# Patient Record
Sex: Female | Born: 1975 | Race: Black or African American | Hispanic: No | Marital: Married
Health system: Southern US, Community
[De-identification: ages and names within clinical notes are randomized; demographics above are authoritative.]

---

## 2006-08-15 ENCOUNTER — Emergency Department: Payer: Self-pay | Admitting: Emergency Medicine

## 2007-11-18 ENCOUNTER — Inpatient Hospital Stay: Payer: Self-pay | Admitting: Obstetrics & Gynecology

## 2017-04-18 ENCOUNTER — Other Ambulatory Visit: Payer: Self-pay | Admitting: Family Medicine

## 2017-04-18 DIAGNOSIS — Z1231 Encounter for screening mammogram for malignant neoplasm of breast: Secondary | ICD-10-CM

## 2017-05-15 ENCOUNTER — Ambulatory Visit
Admission: RE | Admit: 2017-05-15 | Discharge: 2017-05-15 | Disposition: A | Discharge status: Home / Self Care | Payer: 59 | Source: Ambulatory Visit | Attending: Family Medicine | Admitting: Family Medicine

## 2017-05-15 ENCOUNTER — Other Ambulatory Visit: Payer: Self-pay | Admitting: Family Medicine

## 2017-05-15 DIAGNOSIS — Z1231 Encounter for screening mammogram for malignant neoplasm of breast: Secondary | ICD-10-CM | POA: Diagnosis present

## 2017-12-06 ENCOUNTER — Emergency Department
Admission: EM | Admit: 2017-12-06 | Discharge: 2017-12-06 | Disposition: A | Discharge status: Home / Self Care | Payer: 59 | Attending: Emergency Medicine | Admitting: Emergency Medicine

## 2017-12-06 ENCOUNTER — Other Ambulatory Visit: Payer: Self-pay

## 2017-12-06 ENCOUNTER — Emergency Department: Payer: 59

## 2017-12-06 DIAGNOSIS — M25561 Pain in right knee: Secondary | ICD-10-CM | POA: Insufficient documentation

## 2017-12-06 MED ORDER — TRAMADOL HCL 50 MG PO TABS
50.0000 mg | ORAL_TABLET | Freq: Once | ORAL | Status: AC
  Administered 2017-12-06: 50 mg via ORAL
  Filled 2017-12-06: qty 1

## 2017-12-06 MED ORDER — TRAMADOL HCL 50 MG PO TABS: 50.0000 mg | ORAL_TABLET | Freq: Three times a day (TID) | ORAL | 0 refills | Status: AC | PRN

## 2017-12-06 NOTE — ED Triage Notes (Signed)
Pt states right knee pain since this am. Pt denies known injury. No redness, increased warmth or swelling noted to knee. Pt ambulatory slowly but is able to bear weight.

## 2017-12-06 NOTE — Discharge Instructions (Addendum)
You have been diagnosed with acute knee pain. Your xray was negative for any acute findings. I have given you a RX for Tramadol to take as needed for severe pain. Ice may help reduce pain and inflammation. No heavy lifting, exercise for 1 week. Follow up with your PCP if worsens.

## 2017-12-06 NOTE — ED Provider Notes (Signed)
[facility] Emergency Department Provider Note ____________________________________________  Time seen: 2200  I have reviewed the triage vital signs and the nursing notes.  HISTORY  Chief Complaint  Knee Pain   HPI Heidi Koch is a 42 y.o. female presents to the clinic today with complaint of right knee pain.  She reports she woke up this morning with the knee pain.  She describes the pain is sharp and stabbing.  The pain radiates into her lower leg.  She denies numbness, tingling or weakness.  She denies any injury to the area.  She has not noticed any swelling or bruising.  She is taken ibuprofen with some relief. No past medical history on file.  There are no active problems to display for this patient.     Prior to Admission medications   Medication Sig Start Date End Date Taking? Authorizing Provider  traMADol (ULTRAM) 50 MG tablet Take 1 tablet (50 mg total) by mouth every 8 (eight) hours as needed. 12/06/17 12/06/18  Lorre Munroe, NP    Allergies Patient has no known allergies.  No family history on file.  Social History Social History   Tobacco Use  . Smoking status: Not on file  Substance Use Topics  . Alcohol use: Not on file  . Drug use: Not on file    Review of Systems  Constitutional: Negative for fever. Musculoskeletal: Positive for right knee pain.  Skin: Negative for redness, warmth or swelling. Neurological: Negative for headaches, focal weakness or numbness. ____________________________________________  PHYSICAL EXAM:  VITAL SIGNS: ED Triage Vitals  Enc Vitals Group     BP 12/06/17 2133 (!) 135/97     Pulse Rate 12/06/17 2133 89     Resp 12/06/17 2133 16     Temp 12/06/17 2133 98.9 F (37.2 C)     Temp Source 12/06/17 2133 Oral     SpO2 12/06/17 2133 100 %     Weight 12/06/17 2134 170 lb (77.1 kg)     Height 12/06/17 2134 5\' 4"  (1.626 m)     Head Circumference --      Peak Flow --      Pain Score 12/06/17  2133 10     Pain Loc --      Pain Edu? --      Excl. in GC? --     Constitutional: Alert and oriented. Well appearing and in no distress. Musculoskeletal: Normal flexion and extension of the right knee.  No joint swelling noted.  Pain with palpation of the lateral joint line.  Negative drawer test.  Negative Lachman's.  Strength 5/5 bilateral lower extremities. Neurologic:  Normal gait without ataxia. No gross focal neurologic deficits are appreciated.  ____________________________________________   RADIOLOGY   Imaging Orders     DG Knee Complete 4 Views Right  IMPRESSION: Negative. ____________________________________________  INITIAL IMPRESSION / ASSESSMENT AND PLAN / ED COURSE  Acute Right Knee Pain:  DDX include arthritis, gout, meniscal tear Xray negative for acute findings Tramadol 50 mg given in ER RX provided for Tramadol 50 mg Q8H prn Ice may be helpful She declines referral to ortho at this time   I reviewed the patient's prescription history over the last 12 months in the multi-state controlled substances database(s) that includes [place], [place], [place], [place], [place], [place], [place], [place], [place], [place], [place], [place], [place], and [place].  Results were notable for no controlled substances given. ____________________________________________  FINAL CLINICAL IMPRESSION(S) / ED DIAGNOSES  Final diagnoses:  Acute pain of right knee      Lorre MunroeBaity, Mandy Fitzwater W, NP 12/06/17 2240    Sharman CheekStafford, Phillip, MD 12/07/17 1925

## 2018-04-20 ENCOUNTER — Other Ambulatory Visit: Payer: Self-pay | Admitting: Family Medicine

## 2018-04-20 DIAGNOSIS — Z1231 Encounter for screening mammogram for malignant neoplasm of breast: Secondary | ICD-10-CM

## 2018-05-18 ENCOUNTER — Ambulatory Visit
Admission: RE | Admit: 2018-05-18 | Discharge: 2018-05-18 | Disposition: A | Discharge status: Home / Self Care | Payer: 59 | Source: Ambulatory Visit | Attending: Family Medicine | Admitting: Family Medicine

## 2018-05-18 DIAGNOSIS — Z1231 Encounter for screening mammogram for malignant neoplasm of breast: Secondary | ICD-10-CM | POA: Diagnosis not present

## 2019-04-07 ENCOUNTER — Other Ambulatory Visit: Payer: Self-pay | Admitting: Family Medicine

## 2019-04-07 DIAGNOSIS — Z1231 Encounter for screening mammogram for malignant neoplasm of breast: Secondary | ICD-10-CM

## 2019-05-19 ENCOUNTER — Ambulatory Visit
Admission: RE | Admit: 2019-05-19 | Discharge: 2019-05-19 | Disposition: A | Discharge status: Home / Self Care | Payer: BC Managed Care – PPO | Source: Ambulatory Visit | Attending: Family Medicine | Admitting: Family Medicine

## 2019-05-19 DIAGNOSIS — Z1231 Encounter for screening mammogram for malignant neoplasm of breast: Secondary | ICD-10-CM | POA: Insufficient documentation

## 2019-05-25 ENCOUNTER — Other Ambulatory Visit: Payer: Self-pay | Admitting: Family Medicine

## 2019-05-25 DIAGNOSIS — R928 Other abnormal and inconclusive findings on diagnostic imaging of breast: Secondary | ICD-10-CM

## 2019-05-28 ENCOUNTER — Ambulatory Visit
Admission: RE | Admit: 2019-05-28 | Discharge: 2019-05-28 | Disposition: A | Discharge status: Home / Self Care | Payer: BC Managed Care – PPO | Source: Ambulatory Visit | Attending: Family Medicine | Admitting: Family Medicine

## 2019-05-28 DIAGNOSIS — R928 Other abnormal and inconclusive findings on diagnostic imaging of breast: Secondary | ICD-10-CM | POA: Insufficient documentation

## 2019-06-11 ENCOUNTER — Ambulatory Visit: Payer: BC Managed Care – PPO

## 2019-09-26 IMAGING — MG MM DIGITAL SCREENING BILAT W/ TOMO W/ CAD
9 of 12 series · 9 of 28 positions shown · non-contrast
Comparison: None.

CLINICAL DATA: Screening.

EXAM:
DIGITAL SCREENING BILATERAL MAMMOGRAM WITH TOMO AND CAD

[L MLO]
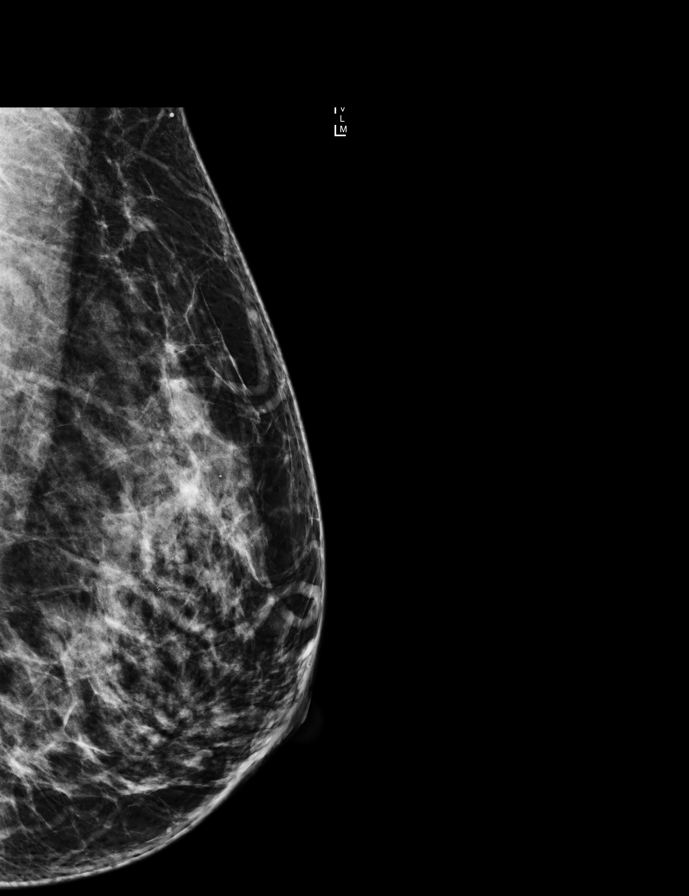

[R MLO synth-2D]
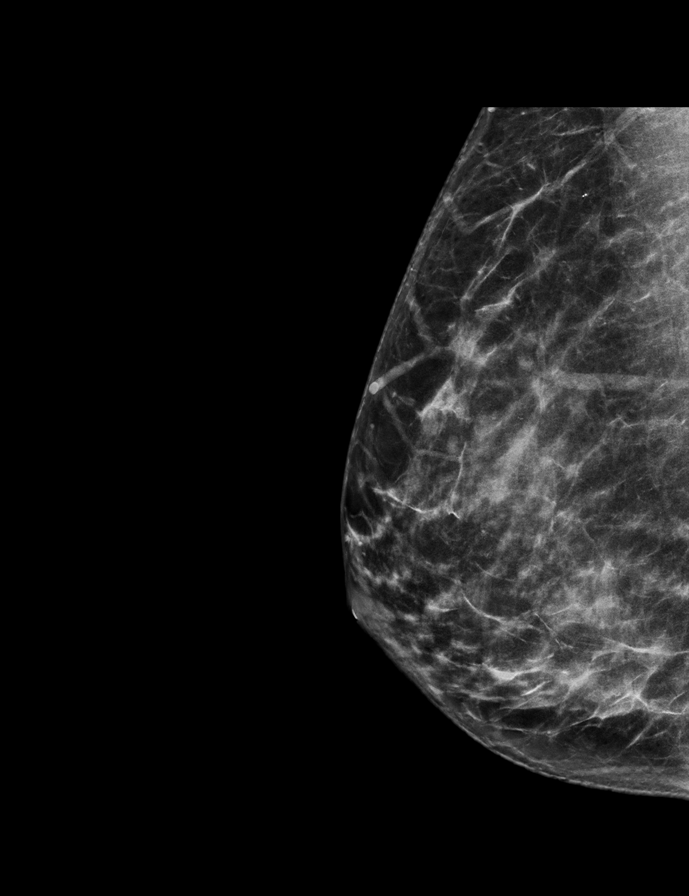

[R CC]
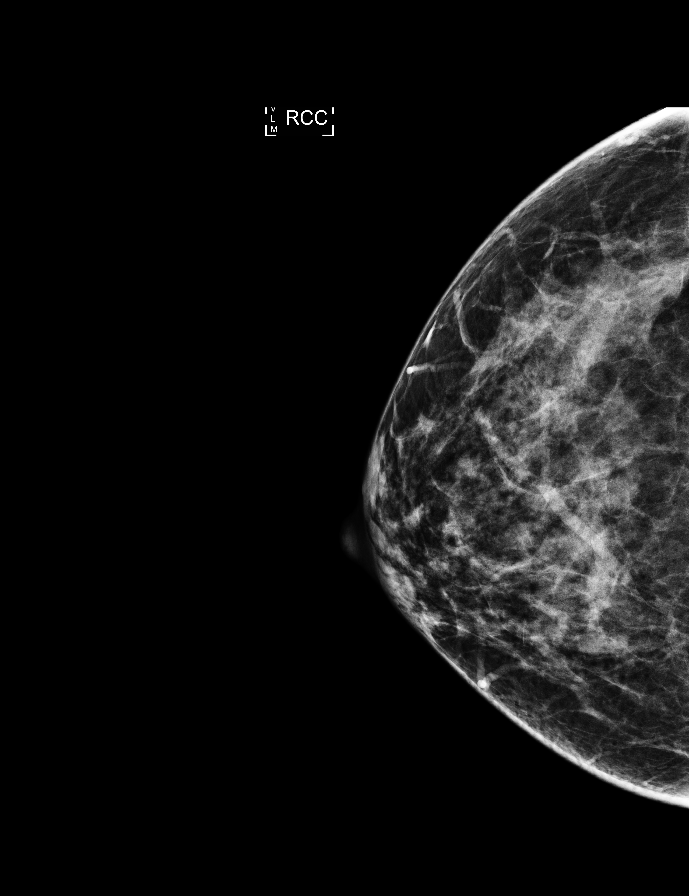

[L MLO synth-2D]
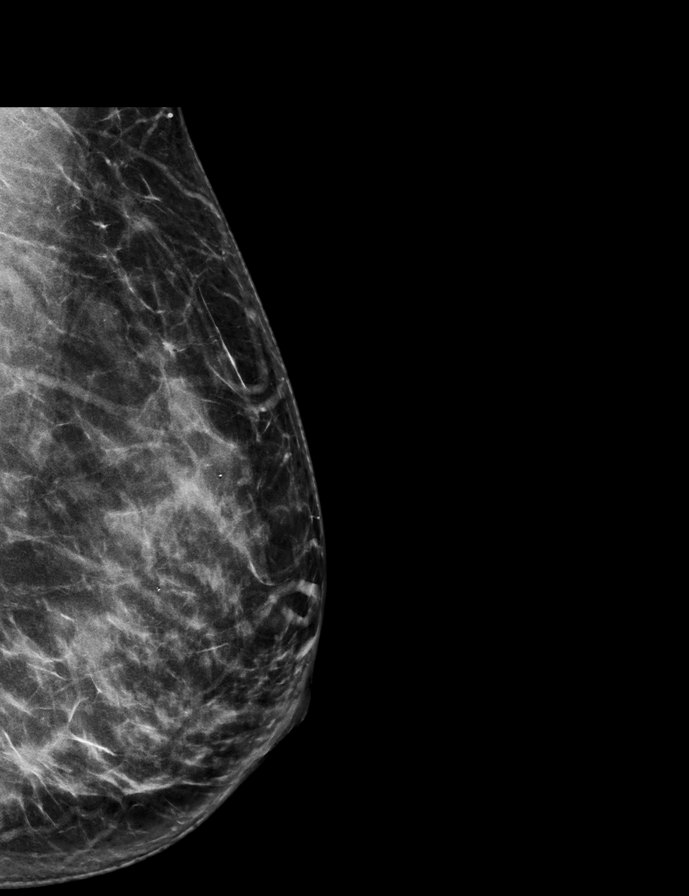

[R MLO]
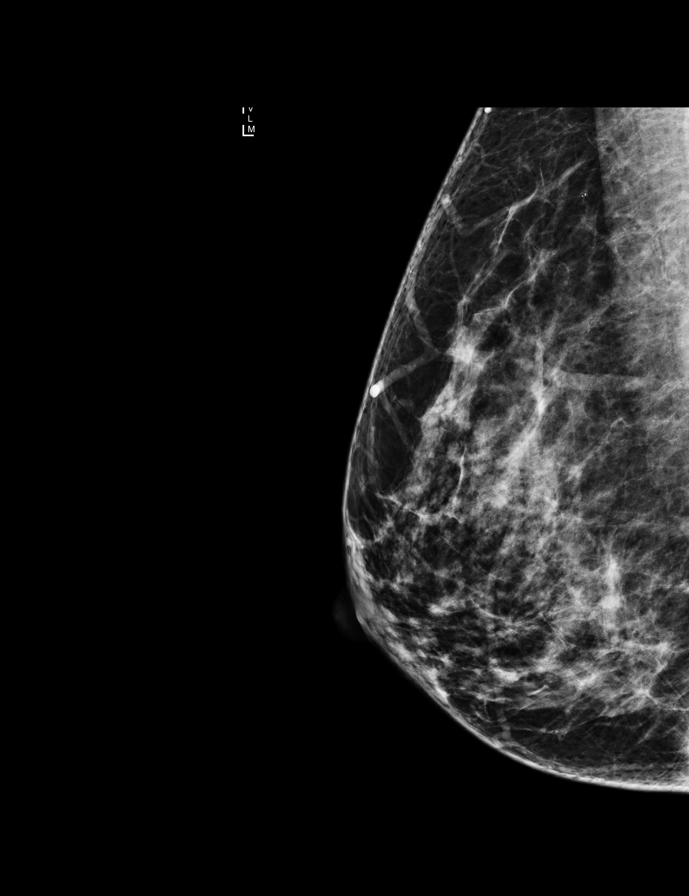

[R CC synth-2D]
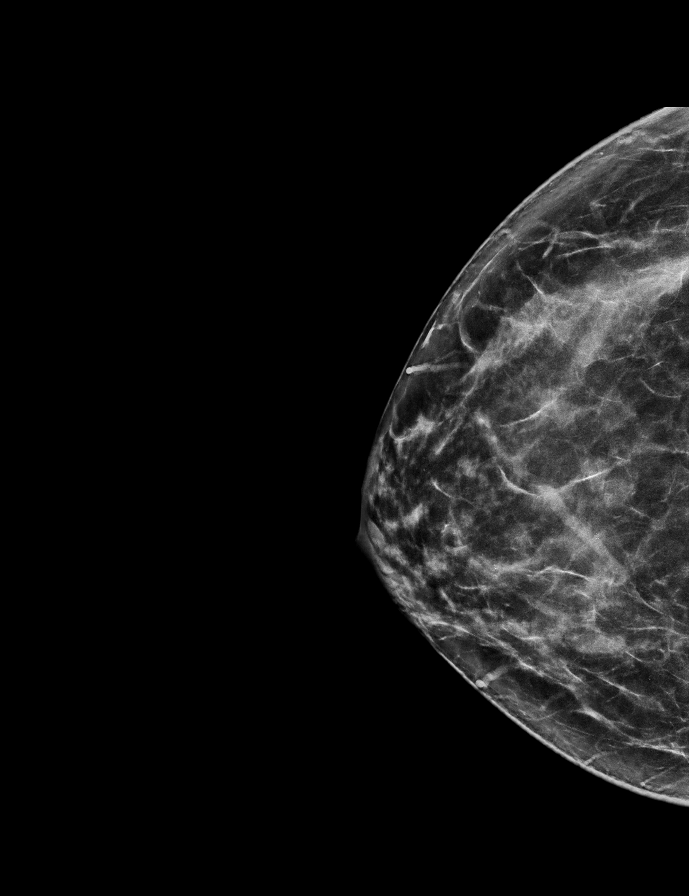

[L CC]
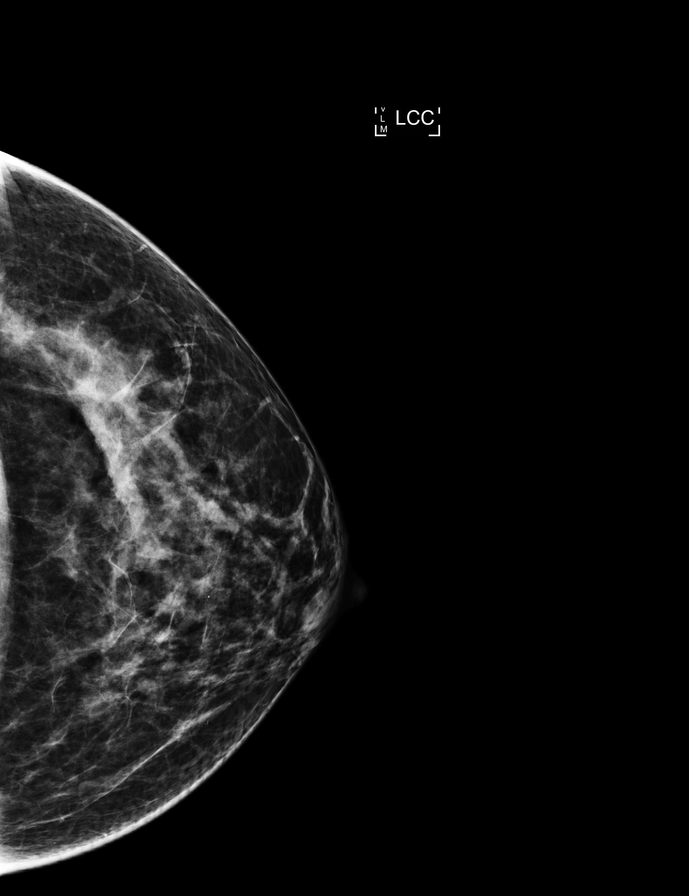

[L CC synth-2D]
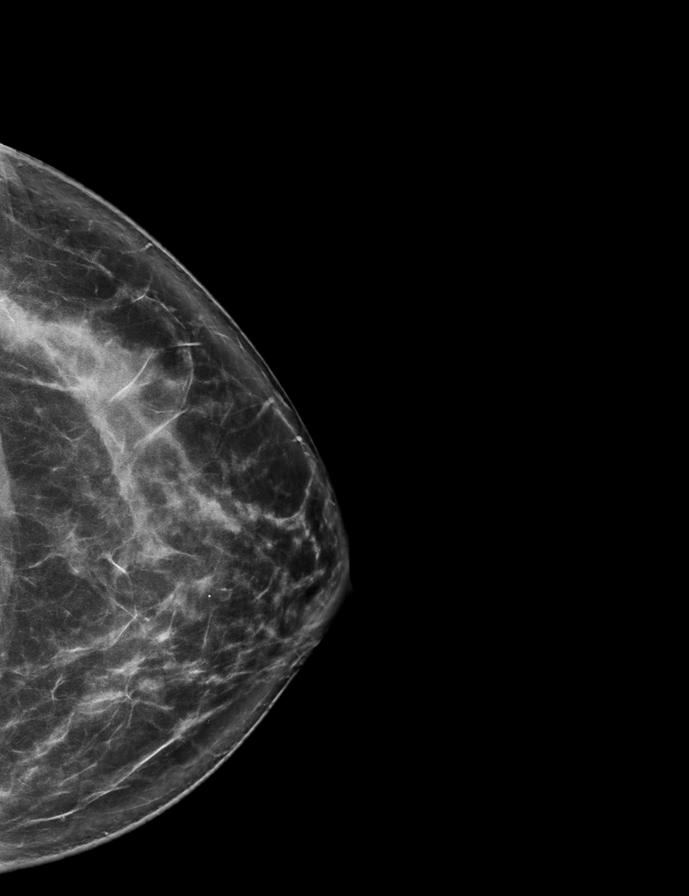

[R CC tomo · tomo slice 35/68.0]
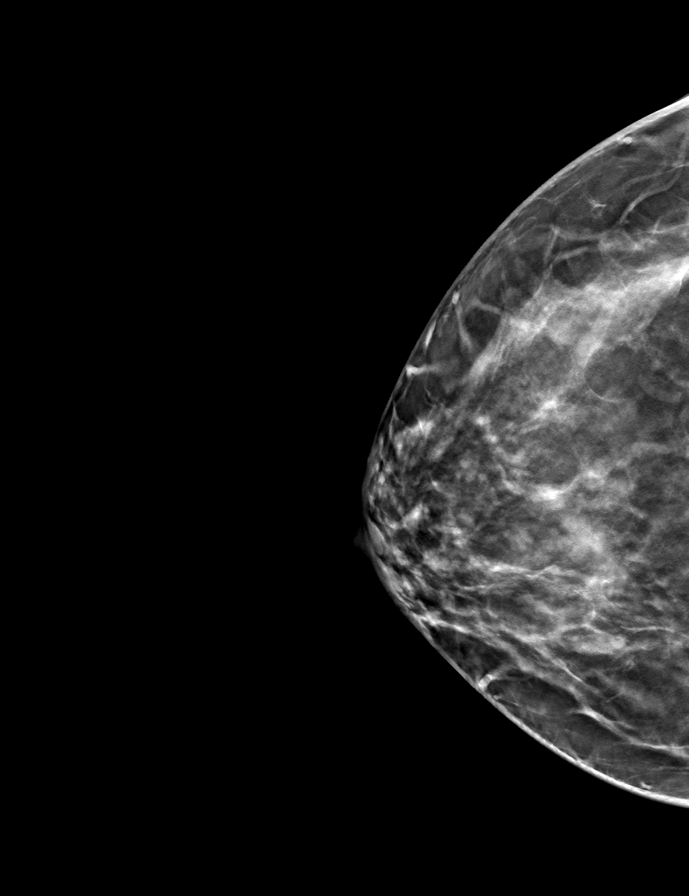

[9 of 28 positions shown; findings below may reference images not displayed]

ACR Breast Density Category c: The breast tissue is heterogeneously
dense, which may obscure small masses
FINDINGS: There are no findings suspicious for malignancy. Images were
processed with CAD.
IMPRESSION: No mammographic evidence of malignancy. A result letter of this
screening mammogram will be mailed directly to the patient.

RECOMMENDATION:
Screening mammogram in one year. (Code:EM-2-IHY)

BI-RADS CATEGORY  1: Negative.

## 2020-04-25 ENCOUNTER — Other Ambulatory Visit: Payer: Self-pay | Admitting: Family Medicine

## 2020-04-25 DIAGNOSIS — Z1231 Encounter for screening mammogram for malignant neoplasm of breast: Secondary | ICD-10-CM

## 2020-05-30 ENCOUNTER — Ambulatory Visit
Admission: RE | Admit: 2020-05-30 | Discharge: 2020-05-30 | Disposition: A | Discharge status: Home / Self Care | Payer: BC Managed Care – PPO | Source: Ambulatory Visit | Attending: Family Medicine | Admitting: Family Medicine

## 2020-05-30 ENCOUNTER — Other Ambulatory Visit: Payer: Self-pay

## 2020-05-30 DIAGNOSIS — Z1231 Encounter for screening mammogram for malignant neoplasm of breast: Secondary | ICD-10-CM | POA: Diagnosis not present

## 2022-04-24 ENCOUNTER — Other Ambulatory Visit: Payer: Self-pay | Admitting: Family Medicine

## 2022-04-24 DIAGNOSIS — Z1231 Encounter for screening mammogram for malignant neoplasm of breast: Secondary | ICD-10-CM

## 2022-05-17 ENCOUNTER — Other Ambulatory Visit (HOSPITAL_BASED_OUTPATIENT_CLINIC_OR_DEPARTMENT_OTHER): Payer: Self-pay | Admitting: Family Medicine

## 2022-05-17 ENCOUNTER — Other Ambulatory Visit: Payer: Self-pay | Admitting: Family Medicine

## 2022-05-17 DIAGNOSIS — N939 Abnormal uterine and vaginal bleeding, unspecified: Secondary | ICD-10-CM

## 2022-05-22 ENCOUNTER — Ambulatory Visit
Admission: RE | Admit: 2022-05-22 | Discharge: 2022-05-22 | Disposition: A | Discharge status: Home / Self Care | Payer: BC Managed Care – PPO | Source: Ambulatory Visit | Attending: Family Medicine | Admitting: Family Medicine

## 2022-05-22 DIAGNOSIS — Z1231 Encounter for screening mammogram for malignant neoplasm of breast: Secondary | ICD-10-CM | POA: Insufficient documentation

## 2022-05-28 ENCOUNTER — Ambulatory Visit: Payer: BC Managed Care – PPO

## 2023-09-18 ENCOUNTER — Other Ambulatory Visit: Payer: Self-pay | Admitting: Family Medicine

## 2023-09-18 DIAGNOSIS — Z1231 Encounter for screening mammogram for malignant neoplasm of breast: Secondary | ICD-10-CM

## 2023-09-29 ENCOUNTER — Ambulatory Visit
Admission: RE | Admit: 2023-09-29 | Discharge: 2023-09-29 | Disposition: A | Discharge status: Home / Self Care | Source: Ambulatory Visit | Attending: Family Medicine | Admitting: Family Medicine

## 2023-09-29 DIAGNOSIS — Z1231 Encounter for screening mammogram for malignant neoplasm of breast: Secondary | ICD-10-CM | POA: Diagnosis present
